# Patient Record
Sex: Female | Born: 1940 | Race: White | Hispanic: No | Marital: Married | Smoking: Never smoker
Health system: Southern US, Community
[De-identification: ages and names within clinical notes are randomized; demographics above are authoritative.]

## PROBLEM LIST (undated history)

## (undated) DIAGNOSIS — C189 Malignant neoplasm of colon, unspecified: Secondary | ICD-10-CM

## (undated) DIAGNOSIS — F419 Anxiety disorder, unspecified: Secondary | ICD-10-CM

## (undated) DIAGNOSIS — I2510 Atherosclerotic heart disease of native coronary artery without angina pectoris: Secondary | ICD-10-CM

## (undated) DIAGNOSIS — E782 Mixed hyperlipidemia: Secondary | ICD-10-CM

## (undated) HISTORY — DX: Malignant neoplasm of colon, unspecified: C18.9

## (undated) HISTORY — PX: HEMICOLECTOMY: SHX854

## (undated) HISTORY — DX: Mixed hyperlipidemia: E78.2

## (undated) HISTORY — DX: Atherosclerotic heart disease of native coronary artery without angina pectoris: I25.10

## (undated) HISTORY — DX: Anxiety disorder, unspecified: F41.9

---

## 2004-02-06 HISTORY — PX: CORONARY ARTERY BYPASS GRAFT: SHX141

## 2004-07-21 ENCOUNTER — Ambulatory Visit: Payer: Self-pay | Admitting: Cardiology

## 2004-07-24 ENCOUNTER — Ambulatory Visit: Payer: Self-pay | Admitting: Cardiology

## 2004-07-25 ENCOUNTER — Ambulatory Visit (HOSPITAL_COMMUNITY): Admission: RE | Admit: 2004-07-25 | Discharge: 2004-07-25 | Payer: Self-pay | Admitting: *Deleted

## 2004-07-25 ENCOUNTER — Ambulatory Visit: Payer: Self-pay | Admitting: Cardiovascular Disease

## 2004-08-03 ENCOUNTER — Inpatient Hospital Stay (HOSPITAL_COMMUNITY): Admission: RE | Admit: 2004-08-03 | Discharge: 2004-08-07 | Payer: Self-pay | Admitting: Surgery

## 2004-08-29 ENCOUNTER — Ambulatory Visit: Payer: Self-pay | Admitting: Cardiology

## 2004-08-30 ENCOUNTER — Encounter: Payer: Self-pay | Admitting: Cardiology

## 2004-10-16 ENCOUNTER — Ambulatory Visit: Payer: Self-pay | Admitting: Cardiology

## 2004-10-17 ENCOUNTER — Ambulatory Visit: Payer: Self-pay | Admitting: Cardiology

## 2004-11-17 ENCOUNTER — Ambulatory Visit: Payer: Self-pay | Admitting: Cardiology

## 2006-02-17 IMAGING — CR DG CHEST 1V PORT
1 series · 1 of 1 positions shown · non-contrast
Comparison: Portable chest x-ray yesterday.

CLINICAL DATA: Postop CABG. Extubation.

PORTABLE CHEST - 1 VIEW  [DATE]/3001 0100 hours:

[view not recorded]
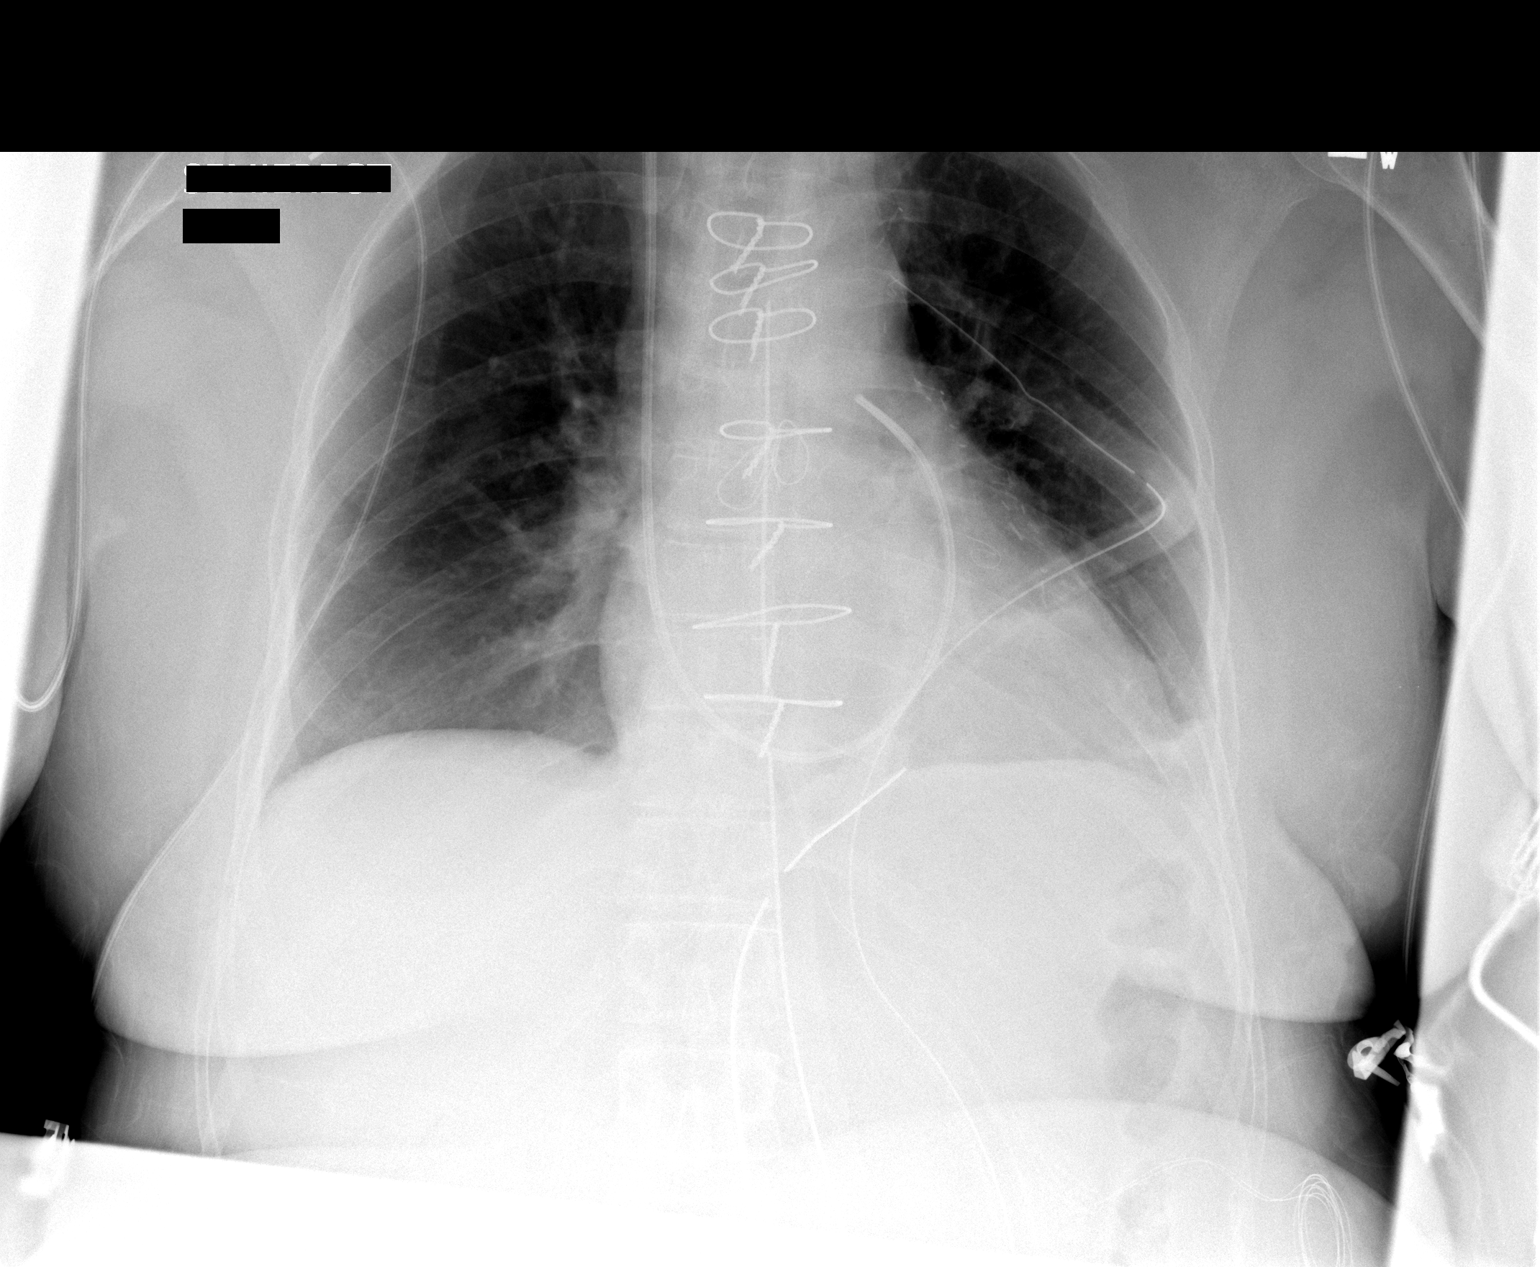

[1 of 1 positions shown; findings below may reference images not displayed]

FINDINGS: Since that time, the patient has been extubated. Mild atelectasis in
the left base is unchanged. The lungs are clear otherwise. There is no pulmonary
edema. The left chest tube remains in place with no pneumothorax. Swan-Ganz
catheter tip remains in the proximal right main pulmonary artery.
IMPRESSION: Tubes and lines satisfactory. Stable minimal left base atelectasis post
extubation. No pneumothorax. No new abnormalities.

## 2007-06-27 ENCOUNTER — Encounter: Payer: Self-pay | Admitting: Cardiology

## 2007-12-24 ENCOUNTER — Encounter: Payer: Self-pay | Admitting: Cardiology

## 2008-01-22 ENCOUNTER — Encounter: Payer: Self-pay | Admitting: Cardiology

## 2008-02-27 ENCOUNTER — Ambulatory Visit: Payer: Self-pay | Admitting: Cardiology

## 2008-04-21 ENCOUNTER — Ambulatory Visit: Payer: Self-pay | Admitting: Cardiology

## 2008-11-29 ENCOUNTER — Encounter (INDEPENDENT_AMBULATORY_CARE_PROVIDER_SITE_OTHER): Payer: Self-pay | Admitting: *Deleted

## 2008-12-03 ENCOUNTER — Ambulatory Visit: Payer: Self-pay | Admitting: Cardiology

## 2008-12-03 DIAGNOSIS — Z951 Presence of aortocoronary bypass graft: Secondary | ICD-10-CM

## 2008-12-03 DIAGNOSIS — R0602 Shortness of breath: Secondary | ICD-10-CM | POA: Insufficient documentation

## 2008-12-03 DIAGNOSIS — E782 Mixed hyperlipidemia: Secondary | ICD-10-CM | POA: Insufficient documentation

## 2008-12-03 DIAGNOSIS — I2510 Atherosclerotic heart disease of native coronary artery without angina pectoris: Secondary | ICD-10-CM

## 2009-01-26 ENCOUNTER — Telehealth (INDEPENDENT_AMBULATORY_CARE_PROVIDER_SITE_OTHER): Payer: Self-pay | Admitting: *Deleted

## 2009-02-10 ENCOUNTER — Telehealth (INDEPENDENT_AMBULATORY_CARE_PROVIDER_SITE_OTHER): Payer: Self-pay | Admitting: *Deleted

## 2009-02-16 ENCOUNTER — Encounter: Payer: Self-pay | Admitting: Cardiology

## 2009-05-31 ENCOUNTER — Ambulatory Visit: Payer: Self-pay | Admitting: Cardiology

## 2010-03-07 NOTE — Assessment & Plan Note (Signed)
Summary: 6 MO FU   Visit Type:  Follow-up Primary Provider:  Sherryll Burger  CC:  follow-up visit.  History of Present Illness: the patient is a 70 year old female with a history of coronary bypass grafting and hypercholesterolemia. The patient is doing well from a cardiovascular standpoint. She denies any substernal chest pain shortness of breath orthopnea PND. She has no palpitations or syncope. Several months ago she had knee surgery done. She has recovered well from this. Her laboratory work is followed by primary care physician. She thinks her cholesterol is approximately 170 mg percent, however she's not exactly sure what her LDL level is.  Preventive Screening-Counseling & Management  Alcohol-Tobacco     Smoking Status: never  Current Medications (verified): 1)  Atenolol 25 Mg Tabs (Atenolol) .... Take One Tablet By Mouth Two Times A Day 2)  Xanax 0.25 Mg Tabs (Alprazolam) .... Take 1 Tablet By Mouth Two Times A Day 3)  Simvastatin 40 Mg Tabs (Simvastatin) .... Take One Tablet By Mouth Daily At Bedtime 4)  Calcium 500 Mg Tabs (Calcium Carbonate) .... Take 2 Tablet By Mouth Once A Day 5)  Vitamin D 1000 Unit Tabs (Cholecalciferol) .... Take 2 Tablet By Mouth Once A Day 6)  Aspirin Ec 325 Mg Tbec (Aspirin) .... Take One Tablet By Mouth Daily 7)  Multivitamins  Tabs (Multiple Vitamin) .... Take 1 Tablet By Mouth Once A Day  Allergies: 1)  ! Pcn 2)  ! Neomycin 3)  ! Cephalexin 4)  ! * Mango  Comments:  Nurse/Medical Assistant: The patient's medications and allergies were reviewed with the patient and were updated in the Medication and Allergy Lists. List reviewed.  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  Past Surgical History: Last updated: 2008-12-22 Cardiac Catheterization  coronary bypass graft surgery x 3  Family History: Last updated: 12/22/08 Father:Died Ischemic heart disease age 44 Mother: died with COPD age 74 Hx of breast cancer, CHF  Social History: Last updated: 2008/12/22 Retired  Alcohol Use - no Drug Use - no  Risk Factors: Smoking Status: never (05/31/2009)  Review of Systems  The patient denies fatigue, malaise, fever, weight gain/loss, vision loss, decreased hearing, hoarseness, chest pain, palpitations, shortness of breath, prolonged cough, wheezing, sleep apnea, coughing up blood, abdominal pain, blood in stool, nausea, vomiting, diarrhea, heartburn, incontinence, blood in urine, muscle weakness, joint pain, leg swelling, rash, skin lesions, headache, fainting, dizziness, depression, anxiety, enlarged lymph nodes, easy bruising or bleeding, and environmental allergies.    Vital Signs:  Patient profile:   70 year old female Height:      60 inches Weight:      171 pounds BMI:     33.52 Pulse rate:   60 / minute BP sitting:   113 / 79  (left arm) Cuff size:   large  Vitals Entered By: Carlye Grippe (May 31, 2009 10:17 AM)  Nutrition Counseling: Patient's BMI is greater than 25 and therefore counseled on weight management options. CC: follow-up visit   Physical Exam  Additional Exam:  General: Well-developed, well-nourished in no distress head: Normocephalic and atraumatic eyes PERRLA/EOMI intact, conjunctiva and lids normal nose: No deformity or lesions mouth normal dentition, normal posterior pharynx neck: Supple, no JVD.  No masses, thyromegaly or abnormal cervical nodes lungs: Normal breath sounds  bilaterally without wheezing.  Normal percussion heart: regular rate and rhythm with normal S1 and S2, no S3 or S4.  PMI is normal.  No pathological murmurs abdomen: Normal bowel sounds, abdomen is soft and nontender without  masses, organomegaly or hernias noted.  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 She denies any shortness of breath.  Problem # 2:  CAD (ICD-414.00) patient denies any chest pain. EKG was reviewed no acute ischemic changes. Her updated medication list for this problem includes:    Atenolol 25 Mg Tabs (Atenolol) .Marland Kitchen... Take one tablet by mouth two times a day    Aspirin Ec 325 Mg Tbec (Aspirin) .Marland Kitchen... Take one tablet by mouth daily  Orders: EKG w/ Interpretation (93000)  Problem # 3:  HYPERLIPIDEMIA (ICD-272.4) laboratory work is followed by primary care. Her updated medication list for this problem includes:    Simvastatin 40 Mg Tabs (Simvastatin) .Marland Kitchen... Take one tablet by mouth daily at bedtime  Patient Instructions: 1)  Your physician recommends that you continue on your current medications as directed. Please refer to the Current Medication list given to you today. 2)  Follow up in  1 year

## 2010-03-07 NOTE — Letter (Signed)
Summary: External Correspondence/ FAXED [facility]  External Correspondence/ FAXED [facility]   Imported By: Dorise Hiss 02/22/2009 12:13:53  _____________________________________________________________________  External Attachment:    Type:   Image     Comment:   External Document

## 2010-03-07 NOTE — Progress Notes (Signed)
Summary: cardiac clearance  Phone Note From Other Clinic   Caller: [facility] - Dr. Rinaldo Ratel   Summary of Call: phone:  [phone] fax:  [phone]  Request cardiac clearance for knee arhtroscopy. Hoover Brunette, LPN  February 10, 2009 12:21 PM   Follow-up for Phone Call        the patient is cleared from a cardiac standpoint for knee arthroscopy.  I will make an append to my last office note and this can be used as cardiac clearance. Follow-up by: Lewayne Bunting, MD, [facility],  February 15, 2009 11:00 AM  Additional Follow-up for Phone Call Additional follow up Details #1::        Brayton Caves with [facility] notified.  Will fax clearance.  Additional Follow-up by: Hoover Brunette, LPN,  February 16, 2009 4:11 PM

## 2010-03-23 ENCOUNTER — Other Ambulatory Visit: Payer: Self-pay | Admitting: Cardiology

## 2010-05-30 ENCOUNTER — Encounter: Payer: Self-pay | Admitting: Cardiology

## 2010-05-31 ENCOUNTER — Encounter: Payer: Self-pay | Admitting: Cardiology

## 2010-05-31 ENCOUNTER — Ambulatory Visit (INDEPENDENT_AMBULATORY_CARE_PROVIDER_SITE_OTHER): Payer: Medicare Other | Admitting: Cardiology

## 2010-05-31 VITALS — BP 127/87 | HR 67 | Ht 61.0 in | Wt 168.0 lb

## 2010-05-31 DIAGNOSIS — H8110 Benign paroxysmal vertigo, unspecified ear: Secondary | ICD-10-CM | POA: Insufficient documentation

## 2010-05-31 DIAGNOSIS — I2510 Atherosclerotic heart disease of native coronary artery without angina pectoris: Secondary | ICD-10-CM

## 2010-05-31 DIAGNOSIS — E785 Hyperlipidemia, unspecified: Secondary | ICD-10-CM

## 2010-05-31 DIAGNOSIS — R42 Dizziness and giddiness: Secondary | ICD-10-CM

## 2010-05-31 DIAGNOSIS — Z951 Presence of aortocoronary bypass graft: Secondary | ICD-10-CM

## 2010-05-31 DIAGNOSIS — R0602 Shortness of breath: Secondary | ICD-10-CM

## 2010-05-31 NOTE — Patient Instructions (Addendum)
   Referral to Margretta Ditty for BPV (benign positional vertigo) Your physician wants you to follow up in: 6 months.  You will receive a reminder letter in the mail one-two months in advance.  If you don't receive a letter, please call our office to schedule the follow up appointment

## 2010-05-31 NOTE — Progress Notes (Signed)
HPI The patient is a 70 year old female with a history of coronary bypass grafting and hypercholesterolemia. She was last seen approximately year ago. She reported no chest pain or shortness of breath. Typically her laboratory work is followed by her primary care physician. Her total cholesterol at that time was approximately 170 mg percent. She presents for followup. The patient is doing well. She denies any chest pain short of breath orthopnea or PND he feels no palpitations or syncope. She does report symptoms consistent with benign positional vertigo.  Allergies  Allergen Reactions  . Cephalexin     REACTION: itching  . Neomycin   . Penicillins     Current Outpatient Prescriptions on File Prior to Visit  Medication Sig Dispense Refill  . ALPRAZolam (XANAX) 0.25 MG tablet Take 0.25 mg by mouth 2 (two) times daily.        Marland Kitchen aspirin 325 MG tablet Take 325 mg by mouth daily.        Marland Kitchen atenolol (TENORMIN) 25 MG tablet Take 25 mg by mouth 2 (two) times daily.        . Cholecalciferol (VITAMIN D) 1000 UNITS capsule Take 2,000 Units by mouth daily.        . Multiple Vitamin (MULTIVITAMIN) tablet Take 1 tablet by mouth daily.        . simvastatin (ZOCOR) 40 MG tablet Take 40 mg by mouth at bedtime.        Marland Kitchen DISCONTD: calcium carbonate (TUMS - DOSED IN MG ELEMENTAL CALCIUM) 500 MG chewable tablet Chew 2 tablets by mouth daily.          Past Medical History  Diagnosis Date  . Shortness of breath   . Coronary atherosclerosis of unspecified type of vessel, native or graft     status post coronary bypass grafting  . Other and unspecified hyperlipidemia   . Postsurgical aortocoronary bypass status   . Anxiety   . Chest pain, unspecified     Cardiac Catheterization    Past Surgical History  Procedure Date  . Coronary artery bypass graft     Family History  Problem Relation Age of Onset  . Heart disease Father 11    ISCHEMIC HEART DISEASE  . COPD Mother 28    HISTORY OF BREAST CANCER,  CHF    History   Social History  . Marital Status: Married    Spouse Name: ALFRED    Number of Children: N/A  . Years of Education: N/A   Occupational History  . RETIRED    Social History Main Topics  . Smoking status: Never Smoker   . Smokeless tobacco: Never Used  . Alcohol Use: Yes     Wine-1/2 glass 2/week  . Drug Use: No  . Sexually Active: Not on file   Other Topics Concern  . Not on file   Social History Narrative  . No narrative on file   Review of systems:Pertinent positives as outlined above. The remainder of the 18  point review of systems is negative  PHYSICAL EXAM BP 127/87  Pulse 67  Ht 5\' 1"  (1.549 m)  Wt 168 lb (76.204 kg)  BMI 31.74 kg/m2  General: Well-developed, well-nourished in no distress Head: Normocephalic and atraumatic Eyes:PERRLA/EOMI intact, conjunctiva and lids normal Ears: No deformity or lesions Mouth:normal dentition, normal posterior pharynx Neck: Supple, no JVD.  No masses, thyromegaly or abnormal cervical nodes Lungs: Normal breath sounds bilaterally without wheezing.  Normal percussion Cardiac: regular rate and rhythm with normal S1 and  S2, no S3 or S4.  PMI is normal.  No pathological murmurs Abdomen: Normal bowel sounds, abdomen is soft and nontender without masses, organomegaly or hernias noted.  No hepatosplenomegaly MSK: Back normal, normal gait muscle strength and tone normal Vascular: Pulse is normal in all 4 extremities Extremities: No peripheral pitting edema Neurologic: Alert and oriented x 3 Skin: Intact without lesions or rashes Lymphatics: No significant adenopathyPsychologic: Normal affect  ECG: Normal sinus rhythm no acute abnormalities  ASSESSMENT AND PLAN

## 2010-05-31 NOTE — Assessment & Plan Note (Signed)
Stable no recurrent symptoms.

## 2010-05-31 NOTE — Assessment & Plan Note (Signed)
Currently the patient is asymptomatic. She will be seen back in 6 months at that time we'll set her up for a Cardiolite stress test the years after her last study.

## 2010-05-31 NOTE — Assessment & Plan Note (Signed)
Followed by her primary care physician. I made no changes in her medical therapy.

## 2010-05-31 NOTE — Assessment & Plan Note (Signed)
I referred the patient to Margretta Ditty for repositioning exercises. She does not like taking meclizine and this is ineffective therapy anyway

## 2010-06-13 NOTE — Progress Notes (Signed)
Addended by: Hoover Brunette on: 06/13/2010 09:49 AM   Modules accepted: Orders

## 2010-06-20 NOTE — Assessment & Plan Note (Signed)
[facility]                          [facility] OFFICE NOTE   NAME:Cheryl Gallagher, Cheryl Gallagher                         MRN:          [id number]  DATE:02/27/2008                            DOB:          [birth year]    REFERRING PHYSICIAN:  Kirstie Peri, MD   HISTORY OF PRESENT ILLNESS:  The patient is a very pleasant 70 year old  female with a history of coronary artery disease, status post coronary  artery bypass grafting in 2006.  Interestingly, the patient at that time  presented only with shortness of breath, but no chest pain.  Dr. Constance Goltz  ultimately referred her for cardiac catheterization and was found to  have multivessel coronary artery disease.  Since that time, the patient  has been doing well.  She currently denies any substernal chest pain or  decreased exercise tolerance.  The patient is able to climb a hill up to  her house without too much difficulties which is about 1/8th of a mile.  She really has noticed that she has not seen any decrease in her  exercise tolerance.  Her only concern which prompted a stress test is  the fact that in the evenings, she experiences some shortness of breath  when she plays a musical instrument that is situated over her lap.  She  feels tight and she actually wears a special bras, not to put or exert  any pressure on her lower abdomen.  This causes some anxiety.  When she  gets short of breath and she becomes somewhat anxious and she feels that  were similar to her presenting symptoms prior to coronary artery bypass  grafting.  However, the patient has not tried to take any nitroglycerin  or Xanax __________ the patient does have an anxiety disorder with a  prior history of panic attacks.  I questioned the patient, whether she  thought this would be some anxiety related and she does feel that there  is indeed anxiety during these times.  She did not try to take any  Xanax, however.  Because of concerns with prior  symptomatology, she  underwent a stress test in Dr. Margaretmary Eddy office which showed that she was  able to exercise for 7 minutes without any chest pain.  She was able to  reach 85% of maximum predicted heart rate with nonspecific EKG change  with 1-mm upstroke and ST-segment depression. There was, however, normal  myocardial perfusion and a normal myocardial function.  Also, laboratory  work was done which was essentially within normal limits and the patient  is now referred for further evaluation and to discuss results of her  stress test.   ALLERGIES:  PENICILLIN, CIPRO, and NEOMYCIN.   SOCIAL HISTORY:  The patient is retired.  She does not smoke or drink.   FAMILY HISTORY:  Noncontributory.   Also, the patient walks the stairs and elliptical treadmill.   PAST MEDICAL HISTORY:  Documented as above includes coronary bypass  grafting and carpal tunnel release.  She also has a history of  dyslipidemia.  She is taking currently Zocor.  Lipid panels  are followed  by Dr. Sherryll Burger.   REVIEW OF SYSTEMS:  As per HPI.  The patient denies any nausea,  vomiting, fever, or chills.  No melena or hematochezia.  No dysuria or  frequency.  No palpitations or syncope.   PHYSICAL EXAMINATION:  VITAL SIGNS:  Blood pressure is 130/82, heart  rate is 60 beats per minute, and weight is 162 pounds.  GENERAL:  Well-nourished white female in no apparent distress.  HEENT:  Pupils isocoric.  Conjunctivae clear.  NECK:  Supple.  Normal carotid stroke.  No carotid bruits.  LUNGS:  Clear breath sounds bilaterally.  HEART:  Regular rate and rhythm.  Normal S1 and S2.  No murmur, rubs, or  gallops.  ABDOMEN:  Soft and nontender with no rebound or guarding.  Good bowel  sounds.  EXTREMITIES:  No cyanosis, clubbing, or edema.  NEURO:  The patient is alert and oriented.  Grossly nonfocal.   PROBLEMS:  1. Atypical dyspnea.  2. Coronary artery disease, status post coronary bypass graft.  3. Essentially normal  Cardiolite stress study with a good exercise      tolerance.  4. Anxiety.   PLAN:  1. I told the patient that I think it is very unlikely that her      symptoms represent angina or ischemia.  I have requested that she      either take Xanax or p.r.n. nitroglycerin in the evening when she      has symptoms.  2. Also, told her that I would prefer that she would be switched to      Klonopin as this is somewhat longer acting and not clearly would      help her with her anxiety disorder better.  She is willing to      change this and she will hold her Xanax.  She can still take p.r.n.      nitroglycerin.  3. I have given the patient my email address and if she does not      respond to the medical therapy, we certainly can consider cardiac      catheterization, although I do not think that this is indicated at      the present time.     Learta Codding, MD,FACC  Electronically Signed    GED/MedQ  DD: 02/27/2008  DT: 02/27/2008  Job #: [id number]   cc:   Kirstie Peri, MD

## 2010-06-20 NOTE — Assessment & Plan Note (Signed)
[facility]                          [facility] NOTE   NAME:Crisp, RONDALYN BELFORD                         MRN:          [id number]  DATE:04/21/2008                            DOB:          10/29/1940    REFERRING PHYSICIAN:  Kirstie Peri, MD   HISTORY OF PRESENT ILLNESS:  The patient presents for followup after  initial visit for an abnormal Cardiolite on February 27, 2008; however,  the perfusion images were normal.  There were some EKG changes.  The  patient complained of atypical dyspnea.  I felt that the patient's  symptoms are more related to anxiety.  The patient states that she has  been doing well.  She denies any chest pain, shortness of breath,  orthopnea, or PND.  This actually doing quite well.  Unfortunately, they  are planning to move back to [place] and she wants to get her medical  records.   MEDICATIONS:  1. Atenolol 25 mg p.o. b.i.d.  2. Xanax 0.25 two tablets daily.  3. Zocor 40 mg p.o. daily.  4. Vitamin D, vitamin C, E, calcium D, vitamin B12.  5. Folic acid.  6. Aspirin 325 daily.   PHYSICAL EXAMINATION:  VITAL SIGNS:  Blood pressure 118/68, heart rate  62, weight 163.  GENERAL:  Well-nourished white female in no apparent distress.  HEENT:  Pupils; eyes are clear.  Conjunctivae clear.  NECK:  Supple.  Normal carotid upstroke.  No carotid bruits.  LUNGS:  Clear.  HEART:  Regular rate and rhythm.  Normal S1 and S2.  ABDOMEN:  Soft and nontender.  EXTREMITIES:  No cyanosis, clubbing, or edema.   A 12-lead electrocardiogram was reviewed, normal sinus rhythm with old  septal infarct pattern unchanged from prior tracing.   PROBLEM LIST:  1. Atypical dyspnea, resolved likely secondary to anxiety.  2. Coronary artery disease status post coronary bypass grafting.  3. Essentially normal Cardiolite stress with good exercise tolerance.   PLAN:  1. The patient has significant amount of stress as she is planning to      sell  her house, which is the former house of Dr. Diona Browner here in      [place].  She is moving back to [place] as her husband cannot get      adopted to the area.  I adjusted to the area.  2. She is moving to the [facility] area and I told that I have several      Cardiology friends there and I have given her particularly the name      of one physician, Dr. Sue Lush who I think would be a good      match for her.  3. We will make sure the patient gets all her medical records from our      practice in the event that she will move in next couple of months      or year or so.     Learta Codding, MD,FACC  Electronically Signed    GED/MedQ  DD: 04/21/2008  DT: 04/22/2008  Job #: [id number]  cc:   Kirstie Peri, MD

## 2010-06-23 NOTE — Op Note (Signed)
Cheryl Gallagher, Cheryl Gallagher                   ACCOUNT NO.:  [id number]   MEDICAL RECORD NO.:  [id number]          PATIENT TYPE:  INP   LOCATION:  2308                         FACILITY:  [facility]   PHYSICIAN:  Evelene Croon, M.D.     DATE OF BIRTH:  11/23/1940   DATE OF PROCEDURE:  08/03/2004  DATE OF DISCHARGE:                                 OPERATIVE REPORT   PREOPERATIVE DIAGNOSIS:  Severe two vessel coronary artery disease.   POSTOPERATIVE DIAGNOSIS:  Severe two vessel coronary artery disease.   OPERATIVE PROCEDURE:  Median sternotomy, extracorporeal circulation,  coronary bypass graft surgery x 3 using left internal mammary artery graft  to the left anterior descending  coronary artery, saphenous vein graft to  the diagonal branch of the left anterior descending, and a saphenous vein  graft to the posterior descending branch of the right coronary artery,  endoscopic vein harvesting from the right leg.   SURGEON:  Evelene Croon, M.D.   ASSISTANT:  Kerin Perna, M.D.   SECOND ASSISTANT:  Stephanie Acre Dominick, P.A.-C.   ANESTHESIA:  General endotracheal anesthesia.   CLINICAL HISTORY:  This patient is a 70 year old previously healthy woman  who began developing exertional shortness of breath and chest tightness in  January 2006.  This was associated with numbness and tingling of her hands  and fatigue.  She underwent a Cardiolite scan that showed a large  anteroseptal defect extending from the apex to the base.  This was partially  reversible.  Ejection fraction was 66% with some apical and anterior  hypokinesis.  She subsequently underwent cardiac catheterization on July 25, 2004, which showed complete occlusion of the proximal LAD after a first  diagonal branch.  The LAD filled well by collaterals from the right coronary  artery.  The diagonal branch had a tight ostial stenosis.  The left  circumflex had no significant disease.  The right coronary artery had 30-40%  tubular disease in  the mid and distal portion.  Left ventricular ejection  fraction was about 60% with minimal apical hypokinesis.  There was no  gradient across the aortic valve and no mitral regurgitation.  Right and  left heart pressure were normal.  After review of the angiogram and  examination of the patient, it was felt that coronary artery bypass graft  surgery was the best treatment to prevent further ischemia and infarction  and improve her quality of life.  I discussed the operative procedure with  her an her husband including alternatives, benefits, and risks, including  bleeding, blood transfusion, infection, stroke, myocardial infarction, graft  failure, and death.  They understood and agreed to proceed.   OPERATIVE PROCEDURE:  The patient was taken to the operating room and placed  on the table in supine position.  After induction of general endotracheal  anesthesia, a Foley catheter was placed in the bladder using sterile  technique.  The chest, abdomen, and both lower extremities were prepped and  draped in the usual sterile manner.  The chest was entered through a median  sternotomy incision and the pericardium  opened in the midline.  Examination  of the heart showed good ventricular contractility.  The ascending aorta had  no palpable plaques in it.  Then, the left internal mammary artery was  harvested from the chest wall as a pedicle graft.  This was a medium caliber  vessel with excellent blood flow through it.  At the same time, a segment of  greater saphenous vein was harvested from the right leg using endoscopic  vein harvest technique.  This vein was of medium size and good quality.  The  patient was heparinized and when an adequate activated clotting time was  achieved, the distal ascending aorta was cannulated using a 20 French aortic  cannula for arterial in flow.  Venous outflow was achieved using two staged  venous cannula through the right atrial appendage.  An antegrade   cardioplegia and vent cannula was inserted in the aortic root.   The patient was placed on cardiopulmonary bypass and the distal coronaries  were identified.  The LAD was a medium size graftable vessel.  The diagonal  branch was small but graftable.  The right coronary artery was diffusely  diseased and gave off a medium size posterior descending branch that was  graftable.  Then, a retrograde cardioplegia cannula was inserted into the  right atrium and advanced into the coronary sinus.  The aorta was  crossclamped and 500 mL of cold blood antegrade cardioplegia was  administered in the aortic root, this was followed by 500 mL of cold blood  retrograde cardioplegia.  Systemic hypothermia to 28 degrees Centigrade and  topical hypothermia with iced saline was used.  A temperature probe was  placed in the septum and insulating pad in the pericardium.   The first distal anastomosis was performed to the diagonal branch.  The  internal diameter was about 1.6 mm.  The conduit used was a segment of  greater saphenous vein.  The anastomosis was performed in an end-to-side  fashion using a continuous 7-0 Prolene suture.  Flow was measured in the  graft and was excellent.  The second distal anastomosis was performed to the  posterior descending coronary artery.  The internal diameter was 1.6 mm.  The conduit used was a second segment of greater saphenous vein.  The  anastomosis was performed in an end-to-side fashion using a continuous 7-0  Prolene suture.  Flow was measured through the graft and was excellent.  Then, a dose of retrograde cardioplegia was given.  The third distal  anastomosis was performed to the mid portion of the left anterior descending  coronary artery.  The internal diameter of this vessel was about 1.75 mm.  The conduit used was the left internal mammary graft and this was anastomosed in an end-to-side manner using continuous 8-0 Prolene suture.  The pedicle was sutured to the  epicardium with 6-0 Prolene sutures.  Then,  with the Cross clamp in place, the two proximal vein graft anastomoses were  performed to the aortic root in an end-to-side manner using continuous 6-0  Prolene suture.  The clamp was removed from  the mammary pedicle.  There was  rapid warming of the ventricular septum and return of spontaneous  ventricular fibrillation.  The Cross clamp was removed with a time of 52  minutes and the patient spontaneously converted to sinus rhythm.  The  proximal and distal anastomosis appeared satisfactory and the lie of the  graft satisfactory.  Graft marker was placed around the proximal  anastomosis.  Two  temporary right ventricular and right atrial pacing wires  were placed and brought through the skin.  When the patient had been  rewarmed to 37 degrees Centigrade, she was weaned from cardiopulmonary  bypass on no inotropic agents.  Total bypass time 73 minutes.  Cardiac  function appeared excellent with a cardiac output of 4 liters per minute.  Protamine was given and the venous and aortic cannulae were removed without  difficulty.  Hemostasis was achieved.  Three chest tubes were placed, one in  the posterior pericardium, one in the left pleural space, and one in the  anterior mediastinum.  The pericardium was reapproximated over the heart.  The sternum was closed with #6 stainless steel wires.  The fascia was closed  with continuous #1 Vicryl suture.  The subcutaneous tissue was closed with  continuous 2-0 Vicryl and the skin with 3-0 Vicryl subcuticular closure.  The lower extremity vein harvest site was closed in layers in a similar  manner.  The sponge, needle, and instrument counts were correct according to  the scrub nurse.  Dry, sterile dressings were applied to the incision and  around the chest tubes which were hooked to Pleuravac suction.  The patient  remained hemodynamically stable and was transferred to the SICU in guarded  but stable  condition.      BB/MEDQ  D:  08/03/2004  T:  08/03/2004  Job:  [id number]

## 2010-06-23 NOTE — Discharge Summary (Signed)
Cheryl Gallagher, Cheryl Gallagher                   ACCOUNT NO.:  [id number]   MEDICAL RECORD NO.:  [id number]          PATIENT TYPE:  INP   LOCATION:  2031                         FACILITY:  [facility]   PHYSICIAN:  Evelene Croon, M.D.     DATE OF BIRTH:  August 26, 1940   DATE OF ADMISSION:  08/03/2004  DATE OF DISCHARGE:                                 DISCHARGE SUMMARY   PRIMARY DIAGNOSIS:  Coronary artery disease.   IN-HOSPITAL DIAGNOSES:  1.  Volume overload.  2.  Acute blood loss anemia postoperatively.   SECONDARY DIAGNOSIS:  Hyperlipidemia.   ALLERGIES:  PENICILLIN.   IN-HOSPITAL OPERATIONS/PROCEDURES:  Coronary artery bypass grafting x3, left  internal mammary artery graft to the left anterior descending coronary  artery, saphenous vein graft to the diagonal branch of the left anterior  descending, saphenous vein graft to the posterior descending branch of the  right coronary artery.  Note, endoscopic vein harvesting from the right leg  was used.   HISTORY AND PHYSICAL/HOSPITAL COURSE:  Cheryl Gallagher was evaluated by Dr. Laneta Simmers  on August 01, 2004.  She is a 70 year old female, previously healthy woman who  moved from [place], [place] to [place] with her husband June 2006.  She said that around the time of her move she began having shortness of  breath and chest tightness.  This was typically associated with exertion.  She was used to walking several miles per day, but noticed that she could  not walk very far anymore.  This was associated with numbness and tingling  in her hands and feet.  She had exercise Cardiolite scan that showed a large  anterior septal defect extending from the apex to the base.  It was  partially reversible.  Her ejection fraction was 66% with some anterior  apical hypokinesis.  She subsequently underwent a cardiac catheterization by  Dr. Eden Emms on July 25, 2004.  This showed complete occlusion of the proximal  LAD.  The LAD filled well from collaterals from right  coronary.  The left  circumflex had no significant disease.  The right coronary artery had 30-40%  tubular disease in the mid and distal portion.  Left ventricular ejection  fraction was about 60% with minimal apical hypokinesis.  There is no  gradient across the aortic valve and no mitral regurgitation.  Right and  left heart pressures were normal.  The patient was seen and evaluated by Dr.  Laneta Simmers.  Dr. Laneta Simmers discussed with the patient undergoing coronary artery  bypass grafting.  Dr. Laneta Simmers discussed the risks and benefits with her.  The  patient understands and wishes to proceed.  Surgery was scheduled for August 03, 2004.   For details of the patient's past medical history and physical exam, please  see dictated history and physical.   HOSPITAL COURSE:  Cheryl Gallagher was taken to the operating room on August 03, 2004  where she underwent coronary artery bypass grafting x3 using left internal  mammary artery graft to left anterior descending coronary artery, saphenous  vein graft to the diagonal branch of the left  anterior descending, saphenous  vein graft to the posterior descending branch of the right coronary artery.  Note, endoscopic vein harvesting from the right leg was done.  The patient  tolerated the procedure well and transferred up to the intensive care unit  in stable condition.  Following surgery, the patient was extubated the  evening of surgery.  She was hemodynamically stable.  Postoperative day #1,  the patient was afebrile, normal sinus rhythm.  She was alert and oriented  x3.  Neuro was intact.  She seemed to have a low H&H at 8.5 and 24.7.  Creatinine was stable.  She did receive one unit of packed red blood cells  postoperative day #1.  She was out of bed in chair.  She also was volume  overload and diuretics were started.  On postoperative day #2, the patient  continued to improve.  H&H went up to 9.8 and 28.  She was out of bed  ambulating well.  She is saturating 95%  on one liter.  She remained in  normal sinus rhythm.  On postoperative day #3, the patient continued to  progress well.  She is saturating 92% on room air.  Out of bed ambulating  well.  Incisions have been healing well.  Remained in normal sinus rhythm.  Postoperative day #4, the patient had no acute changes.  She was in normal  sinus rhythm, eager to go home.  She is saturating 93% on room air.  Ambulating and moving around well.  Cheryl Gallagher was discharged to home on  postoperative day #4 in stable condition.  Follow up appointment will be  scheduled with Dr. Laneta Simmers for three weeks.  She will see Dr. Myrtis Ser on August 29, 2004 at 1:15 p.m.  The patient will obtain a PA and lateral chest x-ray  at Dr. Henrietta Hoover appointment which will then bring with her to Dr. Sharee Pimple  appointment.  She received instructions on activity level and incisional  care.  The patient was told no driving until released to do so, no heavy  lifting over 10 pounds.  She was told she could shower washing her incisions  using soap and water.  She is to contact the office if she develops any  drainage or openings from any of her incisional sites.  The patient  acknowledged her understanding.  She was also educated on low-fat, low-salt  diet.  She was told to ambulate per day, progress as tolerated, and continue  her breathing exercises.  The patient, again, acknowledged her  understanding.   DISCHARGE MEDICATIONS:  1.  Aspirin 325 mg p.o. daily.  2.  Lopressor 25 mg p.o. b.i.d.  3.  Lipitor 10 mg p.o. q.h.s.  4.  Lasix 40 mg p.o. daily x5 days.  5.  Potassium chloride 20 mEq p.o. daily x5 days.  6.  Metamucil as taken at home.  7.  Nasacort as taken at home.  8.  Vicodin 5/325 1-2 tabs p.o. q.4-6h p.r.n. pain.       KMD/MEDQ  D:  08/07/2004  T:  08/07/2004  Job:  [id number]

## 2010-06-23 NOTE — Cardiovascular Report (Signed)
[name], [name]                  ACCOUNT NO.:  [id number]   MEDICAL RECORD NO.:  [id number]          PATIENT TYPE:  OIB   LOCATION:  2861                         FACILITY:  [facility]   PHYSICIAN:  Charlton Haws, M.D.     DATE OF BIRTH:  1940/04/12   DATE OF PROCEDURE:  07/25/2004  DATE OF DISCHARGE:                              CARDIAC CATHETERIZATION   PROCEDURE:  Right and left heart catheterization.   CARDIOLOGIST:  Charlton Haws, M.D.   INDICATIONS FOR PROCEDURE:  Cheryl Gallagher is a 70 year old patient with  exertional dyspnea and abnormal Cardiolite study.  A right and left heart  catheterization were done due to her exertional dyspnea and abnormal  Cardiolite.   DESCRIPTION OF PROCEDURE:  The cardiac catheterization was done from the  right femoral artery and vein.  A 6-French arterial sheath was used and a 7-  [place] venous sheath.   RESULTS:  1.  LEFT MAIN CORONARY ARTERY:  The left main coronary artery was normal.  2.  LEFT ANTERIOR DESCENDING CORONARY ARTERY:  There was a flush chronic      occlusion of the proximal left anterior descending coronary artery.  The      LAD filled well from right to left collaterals.  3.  CIRCUMFLEX CORONARY ARTERY:  The circumflex coronary artery was normal.  4.  RIGHT CORONARY ARTERY:  The right coronary artery had 30%-40% tubular      disease in the mid and distal portion.   RAO VENTRICULOGRAPHY:  The RAO ventriculography showed minimal apical  hypokinesis.  The ejection fraction was preserved in the 60% range.  There  was no gradient across the aortic valve and no MR.   PRESSURES:  The right heart catheterization showed normal pulmonary  pressures.  Mean right atrial pressure:  7.  PA pressure:  21/10.  RV pressure:  26/8.  Mean pulmonary capillary wedge pressure:  12.   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  She has a chronic occlusion of the left anterior descending  coronary artery  with an abnormal Cardiolite.  I suspect that with exertion,  the flow reserve is abnormal and her left ventricular end diastolic pressure  goes up.   RECOMMENDATIONS:  The patient will be referred for an elective coronary  artery bypass graft surgery.  She has an appointment to see Dr. Evelene Croon  next Tuesday.  I suspect he will  place a vein graft to the right coronary  artery and a left internal mammary artery to the left anterior descending  coronary artery.   The patient should do extremely well with this surgery.       PN/MEDQ  D:  07/25/2004  T:  07/25/2004  Job:  [id number]   cc:   Dr. Edilia Bo, [facility]   [facility]   Willa Rough, M.D.

## 2010-07-14 ENCOUNTER — Ambulatory Visit: Payer: Medicare Other | Attending: Cardiology | Admitting: *Deleted

## 2010-07-14 DIAGNOSIS — H8110 Benign paroxysmal vertigo, unspecified ear: Secondary | ICD-10-CM | POA: Insufficient documentation

## 2010-07-14 DIAGNOSIS — IMO0001 Reserved for inherently not codable concepts without codable children: Secondary | ICD-10-CM | POA: Insufficient documentation

## 2010-07-18 ENCOUNTER — Ambulatory Visit: Payer: Medicare Other | Admitting: Rehabilitative and Restorative Service Providers"

## 2010-08-01 ENCOUNTER — Ambulatory Visit: Payer: Medicare Other | Admitting: Rehabilitative and Restorative Service Providers"

## 2010-12-19 ENCOUNTER — Encounter: Payer: Self-pay | Admitting: *Deleted

## 2010-12-19 ENCOUNTER — Other Ambulatory Visit: Payer: Self-pay | Admitting: Cardiology

## 2010-12-19 ENCOUNTER — Encounter: Payer: Self-pay | Admitting: Cardiology

## 2010-12-19 ENCOUNTER — Ambulatory Visit (INDEPENDENT_AMBULATORY_CARE_PROVIDER_SITE_OTHER): Payer: Medicare Other | Admitting: Cardiology

## 2010-12-19 ENCOUNTER — Telehealth: Payer: Self-pay | Admitting: *Deleted

## 2010-12-19 VITALS — BP 104/72 | HR 65 | Ht 61.0 in | Wt 171.0 lb

## 2010-12-19 DIAGNOSIS — E785 Hyperlipidemia, unspecified: Secondary | ICD-10-CM

## 2010-12-19 DIAGNOSIS — Z951 Presence of aortocoronary bypass graft: Secondary | ICD-10-CM

## 2010-12-19 DIAGNOSIS — H8110 Benign paroxysmal vertigo, unspecified ear: Secondary | ICD-10-CM

## 2010-12-19 DIAGNOSIS — I2510 Atherosclerotic heart disease of native coronary artery without angina pectoris: Secondary | ICD-10-CM

## 2010-12-19 NOTE — Assessment & Plan Note (Signed)
No recurrent symptoms.

## 2010-12-19 NOTE — Assessment & Plan Note (Signed)
Followed by the patient's primary care physician 

## 2010-12-19 NOTE — Progress Notes (Signed)
CC: Routine followup patient with coronary artery disease.  HPI:  Patient is doing well from a cardiovascular perspective. She reports no chest pain shortness of breath orthopnea PND. She has no palpitations or syncope. The patient recently returned from a trip to [place] and she did experience an anxiety attack while driving in the car. She was also referred for treatment of benign positional vertigo. Her primary care physician follows her cholesterol panel.   PMH: reviewed and listed in Problem List in Electronic Records (and see below)  Allergies/SH/FHX : available in Electronic Records for review  Medications: Current Outpatient Prescriptions  Medication Sig Dispense Refill  . ALPRAZolam (XANAX) 0.25 MG tablet Take 0.25 mg by mouth 2 (two) times daily.        Marland Kitchen aspirin 325 MG tablet Take 325 mg by mouth daily.        Marland Kitchen atenolol (TENORMIN) 25 MG tablet Take 25 mg by mouth 2 (two) times daily.        . calcium carbonate (OS-CAL) 600 MG TABS Takes 2 by mouth daily alternating with 1      . Cholecalciferol (VITAMIN D) 1000 UNITS capsule Take 2,000 Units by mouth daily.        . Multiple Vitamin (MULTIVITAMIN) tablet Take 1 tablet by mouth daily.        . simvastatin (ZOCOR) 40 MG tablet Take 40 mg by mouth at bedtime.          ROS: No nausea or vomiting. No fever or chills.No melena or hematochezia.No bleeding.No claudication  Physical Exam: BP 104/72  Pulse 65  Ht 5\' 1"  (1.549 m)  Wt 171 lb (77.565 kg)  BMI 32.31 kg/m2 General: Well-nourished white female in no distress Neck: Normal carotid upstroke no carotid bruits. JVP 5 cm Lungs: Clear breath sounds bilaterally. Cardiac: Regular rate and rhythm with normal S1-S2 no murmurs or gallops Vascular: Dorsalis and posterior tibial pulses 2+ bilaterally Skin: Warm and dry  12lead ECG: Not available Limited bedside ECHO:N/A   Assessment and Plan

## 2010-12-19 NOTE — Patient Instructions (Signed)
Your physician wants you to follow-up in: 6 months. You will receive a reminder letter in the mail one-two months in advance. If you don't receive a letter, please call our office to schedule the follow-up appointment. Your physician recommends that you continue on your current medications as directed. Please refer to the Current Medication list given to you today. Your physician has requested that you have a stress echocardiogram. For further information please visit [email or web address]. Please follow instruction sheet as given.  If the results of your test are normal or stable, you will receive a letter. If they are abnormal, the nurse will contact you by phone.  

## 2010-12-19 NOTE — Assessment & Plan Note (Signed)
Patient will be scheduled for a stress echocardiogram next week. We will hold atenolol x24 hours.

## 2010-12-19 NOTE — Telephone Encounter (Signed)
stress echocardiogram Scheduled for  12-25-2010 @ [facility] per[facility]

## 2010-12-20 NOTE — Telephone Encounter (Signed)
Auth # [id number] Exp 02/02/11

## 2010-12-25 DIAGNOSIS — R079 Chest pain, unspecified: Secondary | ICD-10-CM

## 2011-03-13 ENCOUNTER — Encounter (HOSPITAL_COMMUNITY): Payer: Self-pay | Admitting: Dietician

## 2011-03-13 NOTE — Progress Notes (Signed)
[facility] Diabetes Class Completion  Date:March 13, 2011  Time: 10:00  AM  Pt attended [facility]'s Diabetes Class on March 13, 2011.   Patient was educated on the following topics: carbohydrate metabolism in relation to diabetes, sources of carbohydrate, carbohydrate counting, meal planning strategies, food label reading, and portion control.   Melody Haver, RD, LDN Date:March 13, 2011 Time: 10:00 AM

## 2011-04-26 ENCOUNTER — Telehealth: Payer: Self-pay | Admitting: *Deleted

## 2011-04-26 ENCOUNTER — Encounter: Payer: Self-pay | Admitting: Cardiology

## 2011-04-26 NOTE — Telephone Encounter (Signed)
Patient inquiring about cardiac clearance.  Stated she was in office with husband on 3/14 and discussed this with you.  Scheduled to have surgery on Monday, March 25 at [facility] with Dr. Marcha Solders for colon cancer.  Stated GD told her he would take care of this the same day that she was in the office.  Patient last seen 12/19/2010.

## 2011-04-26 NOTE — Telephone Encounter (Signed)
Patient notified letter done & faxed to Dr. Marcha Solders.

## 2011-04-26 NOTE — Telephone Encounter (Signed)
Taken care off.

## 2011-06-25 ENCOUNTER — Encounter: Payer: Self-pay | Admitting: Cardiology

## 2011-06-25 ENCOUNTER — Ambulatory Visit (INDEPENDENT_AMBULATORY_CARE_PROVIDER_SITE_OTHER): Payer: Medicare Other | Admitting: Cardiology

## 2011-06-25 VITALS — BP 113/73 | HR 59 | Ht 61.0 in | Wt 159.0 lb

## 2011-06-25 DIAGNOSIS — I2510 Atherosclerotic heart disease of native coronary artery without angina pectoris: Secondary | ICD-10-CM

## 2011-06-25 DIAGNOSIS — Z951 Presence of aortocoronary bypass graft: Secondary | ICD-10-CM

## 2011-06-25 DIAGNOSIS — C189 Malignant neoplasm of colon, unspecified: Secondary | ICD-10-CM | POA: Insufficient documentation

## 2011-06-25 DIAGNOSIS — E785 Hyperlipidemia, unspecified: Secondary | ICD-10-CM

## 2011-06-25 DIAGNOSIS — E7800 Pure hypercholesterolemia, unspecified: Secondary | ICD-10-CM

## 2011-06-25 NOTE — Assessment & Plan Note (Signed)
Last lipid panel was in February 2012. I've ordered a lipid panel and LFTs.

## 2011-06-25 NOTE — Patient Instructions (Signed)
Your physician recommends that you go to the [facility] lab for blood work for lipid and liver function. If the results of your test are normal or stable, you will receive a letter.  If they are abnormal, the nurse will contact you by phone. Your physician wants you to follow up in: 6 months.  You will receive a reminder letter in the mail one-two months in advance.  If you don't receive a letter, please call our office to schedule the follow up appointment

## 2011-06-25 NOTE — Progress Notes (Signed)
Cheryl Bottoms, MD, [facility] ABIM Board Certified in Adult Cardiovascular Medicine,Internal Medicine and Critical Care Medicine    CC: Followup patient with history of coronary artery disease and recent stress echocardiogram  HPI:  The patient is doing well from a cardiac perspective. She denies any chest pain shortness of breath orthopnea PND. She did have some decrease in her activity level because of recent colon surgery March 2013 with a hemicolectomy secondary to limited colon cancer. The patient reports no palpitations presyncope or syncope. We performed in November 2012 stress echocardiogram which was within normal limits with good exercise tolerance. The patient has not had any lipids or LFTs drawn for monitoring of her statin drug therapy since February 2012. She has no other complaints.  PMH: reviewed and listed in Problem List in Electronic Records (and see below) Past Medical History  Diagnosis Date  . Shortness of breath   . Coronary atherosclerosis of unspecified type of vessel, native or graft     status post coronary bypass grafting  . Other and unspecified hyperlipidemia   . Postsurgical aortocoronary bypass status   . Anxiety   . Chest pain, unspecified     Cardiac Catheterization   Past Surgical History  Procedure Date  . Coronary artery bypass graft     Allergies/SH/FHX : available in Electronic Records for review  Allergies  Allergen Reactions  . Cephalexin     REACTION: itching  . Mango Flavor   . Neomycin   . Penicillins    History   Social History  . Marital Status: Married    Spouse Name: ALFRED    Number of Children: N/A  . Years of Education: N/A   Occupational History  . RETIRED    Social History Main Topics  . Smoking status: Never Smoker   . Smokeless tobacco: Never Used  . Alcohol Use: Yes     Wine-1/2 glass 2/week  . Drug Use: No  . Sexually Active: Not on file   Other Topics Concern  . Not on file   Social History Narrative  .  No narrative on file   Family History  Problem Relation Age of Onset  . Heart disease Father 19    ISCHEMIC HEART DISEASE  . COPD Mother 12    HISTORY OF BREAST CANCER, CHF    Medications: Current Outpatient Prescriptions  Medication Sig Dispense Refill  . ALPRAZolam (XANAX) 0.25 MG tablet Take 0.25 mg by mouth 2 (two) times daily.        Marland Kitchen aspirin 325 MG tablet Take 325 mg by mouth daily.        Marland Kitchen atenolol (TENORMIN) 25 MG tablet Take 25 mg by mouth 2 (two) times daily.        . calcium carbonate (OS-CAL) 600 MG TABS Take 1,260 mg by mouth daily. 1      . Cholecalciferol (VITAMIN D) 1000 UNITS capsule Take 2,000 Units by mouth daily.        . Multiple Vitamin (MULTIVITAMIN) tablet Take 1 tablet by mouth daily.        . simvastatin (ZOCOR) 40 MG tablet Take 40 mg by mouth at bedtime.          ROS: No nausea or vomiting. No fever or chills.No melena or hematochezia.No bleeding.No claudication  Physical Exam: BP 113/73  Pulse 59  Ht 5\' 1"  (1.549 m)  Wt 159 lb (72.122 kg)  BMI 30.04 kg/m2 General: Well-nourished white female in no distress. Neck: Normal carotid upstroke  no carotid bruits. No thyromegaly nonnodular thyroid. JVP 6 cm Lungs: Clear breath sounds bilaterally without any wheezing Cardiac: Regular rate and rhythm with normal S1-S2 no murmur rubs or gallops Vascular: No edema. Normal distal pulses Skin: Warm and dry Physcologic: Normal affect and S2 question  12lead ECG: Normal sinus rhythm. No acute ischemic changes Limited bedside ECHO:N/A No images are attached to the encounter.   Assessment and Plan  CAD Patient has no recurrent chest pain. She is very active despite recent colon surgery. She states that she walks approximately 500 steps a day and has no chest pain or shortness of breath  HYPERLIPIDEMIA Last lipid panel was in February 2012. I've ordered a lipid panel and LFTs.  Postsurgical aortocoronary bypass status Stable no recurrent symptoms recent  negative stress echocardiogram  Colon cancer No postoperative cardiac complications    Patient Active Problem List  Diagnoses  . HYPERLIPIDEMIA  . CAD  . Postsurgical aortocoronary bypass status  . Colon cancer

## 2011-06-25 NOTE — Assessment & Plan Note (Signed)
Stable no recurrent symptoms recent negative stress echocardiogram

## 2011-06-25 NOTE — Assessment & Plan Note (Signed)
Patient has no recurrent chest pain. She is very active despite recent colon surgery. She states that she walks approximately 500 steps a day and has no chest pain or shortness of breath

## 2011-06-25 NOTE — Assessment & Plan Note (Signed)
No postoperative cardiac complications

## 2011-06-26 ENCOUNTER — Encounter (INDEPENDENT_AMBULATORY_CARE_PROVIDER_SITE_OTHER): Payer: Medicare Other | Admitting: Hematology and Oncology

## 2011-06-26 DIAGNOSIS — C189 Malignant neoplasm of colon, unspecified: Secondary | ICD-10-CM

## 2011-08-20 ENCOUNTER — Telehealth: Payer: Self-pay | Admitting: *Deleted

## 2011-08-20 NOTE — Telephone Encounter (Signed)
Noted  

## 2011-08-20 NOTE — Telephone Encounter (Signed)
Notes Recorded by Lesle Chris, LPN on 1/61/0960 at 10:57 AM Patient notified of below. Declines changing medication at this time. Prefers to stay with the older drug & did not really trust the Lipitor.

## 2011-08-20 NOTE — Telephone Encounter (Signed)
Message copied by Lesle Chris on Mon Aug 20, 2011 10:57 AM ------      Message from: Rande Brunt      Created: Fri Aug 17, 2011 11:57 AM       LDL 96. Mildly elevated LFTs (less than 2x). Recommend substituting Zocor 40 with Lipitor 40 daily. FLP/LFT profile in 12 weeks

## 2011-10-25 ENCOUNTER — Encounter (INDEPENDENT_AMBULATORY_CARE_PROVIDER_SITE_OTHER): Payer: Medicare Other | Admitting: Hematology and Oncology

## 2011-10-25 DIAGNOSIS — I10 Essential (primary) hypertension: Secondary | ICD-10-CM

## 2011-10-25 DIAGNOSIS — E785 Hyperlipidemia, unspecified: Secondary | ICD-10-CM

## 2011-10-25 DIAGNOSIS — M899 Disorder of bone, unspecified: Secondary | ICD-10-CM

## 2011-10-25 DIAGNOSIS — M949 Disorder of cartilage, unspecified: Secondary | ICD-10-CM

## 2011-10-25 DIAGNOSIS — C186 Malignant neoplasm of descending colon: Secondary | ICD-10-CM

## 2012-06-05 ENCOUNTER — Telehealth: Payer: Self-pay | Admitting: Cardiology

## 2012-06-05 NOTE — Telephone Encounter (Signed)
Yes, okay to schedule.

## 2012-06-05 NOTE — Telephone Encounter (Signed)
Patient is former [facility] patient.  She would like to come back to our practice along with her husband and see you.    Cheryl Gallagher [phone]  Please advise   Thank you

## 2012-08-11 ENCOUNTER — Ambulatory Visit (INDEPENDENT_AMBULATORY_CARE_PROVIDER_SITE_OTHER): Payer: Medicare Other | Admitting: Cardiology

## 2012-08-11 ENCOUNTER — Encounter: Payer: Self-pay | Admitting: Cardiology

## 2012-08-11 VITALS — BP 117/71 | HR 57 | Ht 60.0 in | Wt 172.1 lb

## 2012-08-11 DIAGNOSIS — E782 Mixed hyperlipidemia: Secondary | ICD-10-CM

## 2012-08-11 DIAGNOSIS — I2510 Atherosclerotic heart disease of native coronary artery without angina pectoris: Secondary | ICD-10-CM

## 2012-08-11 NOTE — Assessment & Plan Note (Signed)
Symptomatically stable on medical therapy. ECG reviewed. Low risk exercise echocardiogram noted from 2012. At this point no further testing anticipated. Continue regular exercise which we discussed today. Followup arranged in one year. Keep interval visits with Dr. Sherryll Burger.

## 2012-08-11 NOTE — Patient Instructions (Addendum)
Your physician recommends that you continue on your current medications as directed. Please refer to the Current Medication list given to you today.  Your physician wants you to follow-up in: 1 year. You will receive a reminder letter in the mail two months in advance. If you don't receive a letter, please call our office to schedule the follow-up appointment.  

## 2012-08-11 NOTE — Assessment & Plan Note (Signed)
She reports compliance with simvastatin. Goal LDL should be around 70 if possible. Last LDL 96. Keep visits with Dr. Sherryll Burger.

## 2012-08-11 NOTE — Progress Notes (Signed)
Clinical Summary Cheryl Gallagher is a 72 y.o.female presenting for an office visit. She was last seen in May 2013, a former patient of Dr. Andee Lineman. She has been doing well, no active angina symptoms. States that she has been trying to walk regularly with her husband.  Exercise echocardiogram in November 2012 demonstrated no diagnostic ST segment abnormalities, negative but suboptimal echocardiographic images. Baseline LVEF 55-60%.  Lab work from May 2013 revealed AST 21, ALT 21, triglycerides 178, cholesterol 174, HDL 42, LDL 96. She has been consistent with simvastatin. Lipids are followed by Dr. Sherryll Burger.  ECG today shows sinus bradycardia.   Allergies  Allergen Reactions  . Cephalexin     REACTION: itching  . Mango Flavor   . Neomycin   . Penicillins     Current Outpatient Prescriptions  Medication Sig Dispense Refill  . ALPRAZolam (XANAX) 0.25 MG tablet Take 0.25 mg by mouth 2 (two) times daily.        Marland Kitchen aspirin 325 MG tablet Take 325 mg by mouth daily.        Marland Kitchen atenolol (TENORMIN) 25 MG tablet Take 25 mg by mouth 2 (two) times daily.        . calcium carbonate (OS-CAL) 600 MG TABS Take 1,260 mg by mouth daily. 1      . Cholecalciferol (VITAMIN D) 1000 UNITS capsule Take 2,000 Units by mouth daily.        . Multiple Vitamin (MULTIVITAMIN) tablet Take 1 tablet by mouth daily.        . simvastatin (ZOCOR) 40 MG tablet Take 40 mg by mouth at bedtime.         No current facility-administered medications for this visit.    Past Medical History  Diagnosis Date  . Coronary atherosclerosis of native coronary artery     Multivessel  . Mixed hyperlipidemia   . Anxiety   . Colon cancer     Past Surgical History  Procedure Laterality Date  . Coronary artery bypass graft  2006    Dr. Laneta Simmers - LIMA to LAD, SVG to diagonal, SVG to PDA  . Hemicolectomy      Social History Ms. Carbin reports that she has never smoked. She has never used smokeless tobacco. Ms. Schwinn reports that  drinks  alcohol.  Review of Systems No palpitations, syncope. Stable appetite. No bleeding episodes. No orthopnea or PND. No claudication. Otherwise negative.  Physical Examination Filed Vitals:   08/11/12 0814  BP: 117/71  Pulse: 57   Filed Weights   08/11/12 0814  Weight: 172 lb 1.9 oz (78.073 kg)   Comfortable at rest. HEENT: Conjunctiva and lids normal, oropharynx clear. Neck: Supple, no elevated JVP or carotid bruits, no thyromegaly. Lungs: Clear to auscultation, nonlabored breathing at rest. Thorax: Well-healed sternal incision. Cardiac: Regular rate and rhythm, no S3 or significant systolic murmur, no pericardial rub. Abdomen: Soft, nontender, bowel sounds present, no guarding or rebound. Extremities: No pitting edema, distal pulses 2+. Skin: Warm and dry. Musculoskeletal: No kyphosis. Neuropsychiatric: Alert and oriented x3, affect grossly appropriate.   Problem List and Plan   Coronary atherosclerosis of native coronary artery Symptomatically stable on medical therapy. ECG reviewed. Low risk exercise echocardiogram noted from 2012. At this point no further testing anticipated. Continue regular exercise which we discussed today. Followup arranged in one year. Keep interval visits with Dr. Sherryll Burger.  Mixed hyperlipidemia She reports compliance with simvastatin. Goal LDL should be around 70 if possible. Last LDL 96. Keep visits with Dr.  Aaron Mose, M.D., F.A.C.C.

## 2013-05-14 ENCOUNTER — Telehealth: Payer: Self-pay | Admitting: Cardiology

## 2013-05-14 NOTE — Telephone Encounter (Signed)
Spoke with patient and she wanted to know the name of the medication Dr. Dannielle Burn gave her in the place of xanax. Patient said that her PCP has prescribed something other than xanax and she didn't want it to be the same medication she was given in the past by Dr. Dannielle Burn because it didn't work for her. Nurse reviewed chart and informed patient that clonazepam was mentioned several years back.

## 2013-05-14 NOTE — Telephone Encounter (Signed)
Patient has been taking Xanax since her heart surgery. Dr. Manuella Ghazi told patient that the government wants her to go on another type Of anti depressant . Mrs. Cheryl Gallagher states that Dr.[facility] had tried her on another anti-depressant but it did not work. She is wanting To know the name of that medication.

## 2013-06-09 ENCOUNTER — Telehealth: Payer: Self-pay | Admitting: *Deleted

## 2013-06-09 NOTE — Telephone Encounter (Signed)
Patient called to inform our office that she and her husband will be moving to [place], [place] on Jul 02, 2013. Patient is asking if MD knew of any cardiologists in the area that could be recommended. This city is located [address] [place]. Also located near [place], [place], and [employer].

## 2013-06-09 NOTE — Telephone Encounter (Signed)
Patient informed. 

## 2013-06-09 NOTE — Telephone Encounter (Signed)
I do not have any personal knowledge of cardiologists in that area. They will probably need to research practices in the region and also discuss with any friends they have in that area. We can certainly provide records once they get established with someone.

## 2015-10-05 ENCOUNTER — Telehealth: Payer: Self-pay

## 2015-10-05 NOTE — Telephone Encounter (Signed)
After review of chart & medication list, no other anxiety medication was listed other than the Xanax.  Also, no anxiety medications on her allergies list.  Suggested that she call pharmacy that she was using at that time and they possibly can pull a history for her.  Patient verbalized understanding & appreciative of the call back.

## 2015-10-05 NOTE — Telephone Encounter (Signed)
Cheryl Gallagher moved to [place] . States that several years ago she was prescribed an anxiety medication. Doctor is trying to prescribed a medication but they do not want to put her on this medication due to the fact she was unable to tolerate it.
